# Patient Record
Sex: Male | Born: 1945 | Race: White | Hispanic: No | Marital: Married
Health system: Southern US, Community
[De-identification: ages and names within clinical notes are randomized; demographics above are authoritative.]

---

## 1998-12-26 ENCOUNTER — Encounter (HOSPITAL_COMMUNITY): Admission: RE | Admit: 1998-12-26 | Discharge: 1999-03-26 | Payer: Self-pay | Admitting: Gastroenterology

## 1999-11-20 ENCOUNTER — Encounter (HOSPITAL_COMMUNITY): Admission: RE | Admit: 1999-11-20 | Discharge: 2000-02-18 | Payer: Self-pay | Admitting: Gastroenterology

## 2004-11-12 ENCOUNTER — Ambulatory Visit: Payer: Self-pay | Admitting: Urology

## 2004-11-15 ENCOUNTER — Emergency Department: Payer: Self-pay | Admitting: Emergency Medicine

## 2004-11-16 ENCOUNTER — Ambulatory Visit (HOSPITAL_COMMUNITY): Admission: RE | Admit: 2004-11-16 | Discharge: 2004-11-16 | Payer: Self-pay | Admitting: Gastroenterology

## 2004-11-19 ENCOUNTER — Inpatient Hospital Stay: Payer: Self-pay | Admitting: Surgery

## 2004-12-10 ENCOUNTER — Encounter (HOSPITAL_COMMUNITY): Admission: RE | Admit: 2004-12-10 | Discharge: 2004-12-10 | Payer: Self-pay | Admitting: Gastroenterology

## 2005-02-04 ENCOUNTER — Encounter (HOSPITAL_COMMUNITY): Admission: RE | Admit: 2005-02-04 | Discharge: 2005-05-05 | Payer: Self-pay | Admitting: Gastroenterology

## 2005-05-24 ENCOUNTER — Encounter (HOSPITAL_COMMUNITY): Admission: RE | Admit: 2005-05-24 | Discharge: 2005-08-22 | Payer: Self-pay | Admitting: Gastroenterology

## 2007-06-12 ENCOUNTER — Ambulatory Visit: Payer: Self-pay | Admitting: Nephrology

## 2011-11-28 ENCOUNTER — Inpatient Hospital Stay: Payer: Self-pay | Admitting: Surgery

## 2011-11-28 LAB — COMPREHENSIVE METABOLIC PANEL
Albumin: 3.5 g/dL (ref 3.4–5.0)
Calcium, Total: 9.2 mg/dL (ref 8.5–10.1)
Chloride: 106 mmol/L (ref 98–107)
Co2: 24 mmol/L (ref 21–32)
Creatinine: 1.66 mg/dL — ABNORMAL HIGH (ref 0.60–1.30)
EGFR (Non-African Amer.): 43 — ABNORMAL LOW
Glucose: 156 mg/dL — ABNORMAL HIGH (ref 65–99)
Osmolality: 287 (ref 275–301)
Potassium: 4.2 mmol/L (ref 3.5–5.1)
SGOT(AST): 182 U/L — ABNORMAL HIGH (ref 15–37)
Sodium: 141 mmol/L (ref 136–145)
Total Protein: 7.3 g/dL (ref 6.4–8.2)

## 2011-11-28 LAB — CK TOTAL AND CKMB (NOT AT ARMC)
CK, Total: 109 U/L (ref 35–232)
CK-MB: 3.6 ng/mL (ref 0.5–3.6)

## 2011-11-28 LAB — CBC
HCT: 44.1 % (ref 40.0–52.0)
MCHC: 32.4 g/dL (ref 32.0–36.0)
MCV: 94 fL (ref 80–100)
Platelet: 201 10*3/uL (ref 150–440)
WBC: 12.3 10*3/uL — ABNORMAL HIGH (ref 3.8–10.6)

## 2011-11-29 LAB — CBC WITH DIFFERENTIAL/PLATELET
Basophil #: 0 10*3/uL (ref 0.0–0.1)
Basophil %: 0.3 %
Eosinophil #: 0 10*3/uL (ref 0.0–0.7)
Eosinophil %: 0.6 %
HCT: 38.6 % — ABNORMAL LOW (ref 40.0–52.0)
HGB: 12.7 g/dL — ABNORMAL LOW (ref 13.0–18.0)
Lymphocyte #: 0.2 10*3/uL — ABNORMAL LOW (ref 1.0–3.6)
MCH: 31.2 pg (ref 26.0–34.0)
MCHC: 33 g/dL (ref 32.0–36.0)
Monocyte #: 0.7 x10 3/mm (ref 0.2–1.0)
Monocyte %: 9.5 %
Neutrophil #: 6.2 10*3/uL (ref 1.4–6.5)
Neutrophil %: 86.3 %
WBC: 7.2 10*3/uL (ref 3.8–10.6)

## 2011-11-29 LAB — COMPREHENSIVE METABOLIC PANEL
Alkaline Phosphatase: 122 U/L (ref 50–136)
Anion Gap: 11 (ref 7–16)
Bilirubin,Total: 4.4 mg/dL — ABNORMAL HIGH (ref 0.2–1.0)
Calcium, Total: 7.9 mg/dL — ABNORMAL LOW (ref 8.5–10.1)
Chloride: 108 mmol/L — ABNORMAL HIGH (ref 98–107)
Creatinine: 1.8 mg/dL — ABNORMAL HIGH (ref 0.60–1.30)
EGFR (African American): 45 — ABNORMAL LOW
EGFR (Non-African Amer.): 39 — ABNORMAL LOW
Glucose: 85 mg/dL (ref 65–99)
Osmolality: 287 (ref 275–301)
Potassium: 4.2 mmol/L (ref 3.5–5.1)
SGOT(AST): 81 U/L — ABNORMAL HIGH (ref 15–37)
SGPT (ALT): 172 U/L — ABNORMAL HIGH
Sodium: 143 mmol/L (ref 136–145)

## 2011-11-29 LAB — BILIRUBIN, DIRECT: Bilirubin, Direct: 3 mg/dL — ABNORMAL HIGH (ref 0.00–0.20)

## 2011-11-29 LAB — LIPASE, BLOOD: Lipase: 101 U/L (ref 73–393)

## 2011-11-30 LAB — BASIC METABOLIC PANEL
BUN: 15 mg/dL (ref 7–18)
Chloride: 111 mmol/L — ABNORMAL HIGH (ref 98–107)
Co2: 24 mmol/L (ref 21–32)
Creatinine: 1.79 mg/dL — ABNORMAL HIGH (ref 0.60–1.30)
EGFR (African American): 45 — ABNORMAL LOW
EGFR (Non-African Amer.): 39 — ABNORMAL LOW
Glucose: 77 mg/dL (ref 65–99)
Osmolality: 288 (ref 275–301)
Sodium: 145 mmol/L (ref 136–145)

## 2011-11-30 LAB — HEPATIC FUNCTION PANEL A (ARMC)
Alkaline Phosphatase: 123 U/L (ref 50–136)
Bilirubin, Direct: 1.1 mg/dL — ABNORMAL HIGH (ref 0.00–0.20)
SGPT (ALT): 112 U/L — ABNORMAL HIGH
Total Protein: 5.4 g/dL — ABNORMAL LOW (ref 6.4–8.2)

## 2011-12-01 LAB — COMPREHENSIVE METABOLIC PANEL
Albumin: 2.2 g/dL — ABNORMAL LOW (ref 3.4–5.0)
Alkaline Phosphatase: 117 U/L (ref 50–136)
BUN: 21 mg/dL — ABNORMAL HIGH (ref 7–18)
Bilirubin,Total: 1.1 mg/dL — ABNORMAL HIGH (ref 0.2–1.0)
Calcium, Total: 7.7 mg/dL — ABNORMAL LOW (ref 8.5–10.1)
Co2: 26 mmol/L (ref 21–32)
Creatinine: 1.76 mg/dL — ABNORMAL HIGH (ref 0.60–1.30)
EGFR (Non-African Amer.): 40 — ABNORMAL LOW
Osmolality: 281 (ref 275–301)
Potassium: 3.9 mmol/L (ref 3.5–5.1)
SGOT(AST): 42 U/L — ABNORMAL HIGH (ref 15–37)
SGPT (ALT): 100 U/L — ABNORMAL HIGH
Sodium: 139 mmol/L (ref 136–145)

## 2011-12-01 LAB — CBC WITH DIFFERENTIAL/PLATELET
Basophil #: 0 10*3/uL (ref 0.0–0.1)
Basophil %: 0.3 %
Eosinophil #: 0 10*3/uL (ref 0.0–0.7)
Eosinophil %: 0.3 %
HCT: 36.9 % — ABNORMAL LOW (ref 40.0–52.0)
HGB: 12.4 g/dL — ABNORMAL LOW (ref 13.0–18.0)
Lymphocyte #: 0.3 10*3/uL — ABNORMAL LOW (ref 1.0–3.6)
Lymphocyte %: 4.2 %
MCH: 31.5 pg (ref 26.0–34.0)
MCHC: 33.6 g/dL (ref 32.0–36.0)
MCV: 94 fL (ref 80–100)
Monocyte #: 0.7 x10 3/mm (ref 0.2–1.0)
Monocyte %: 9.1 %
Neutrophil #: 6.7 10*3/uL — ABNORMAL HIGH (ref 1.4–6.5)
Neutrophil %: 86.1 %
Platelet: 163 10*3/uL (ref 150–440)
RBC: 3.93 10*6/uL — ABNORMAL LOW (ref 4.40–5.90)
RDW: 14 % (ref 11.5–14.5)
WBC: 7.7 10*3/uL (ref 3.8–10.6)

## 2011-12-02 LAB — COMPREHENSIVE METABOLIC PANEL
Alkaline Phosphatase: 148 U/L — ABNORMAL HIGH (ref 50–136)
Anion Gap: 9 (ref 7–16)
BUN: 18 mg/dL (ref 7–18)
Bilirubin,Total: 4.2 mg/dL — ABNORMAL HIGH (ref 0.2–1.0)
Calcium, Total: 8 mg/dL — ABNORMAL LOW (ref 8.5–10.1)
Co2: 29 mmol/L (ref 21–32)
Creatinine: 1.85 mg/dL — ABNORMAL HIGH (ref 0.60–1.30)
EGFR (African American): 44 — ABNORMAL LOW
EGFR (Non-African Amer.): 38 — ABNORMAL LOW
Glucose: 98 mg/dL (ref 65–99)
SGOT(AST): 36 U/L (ref 15–37)
SGPT (ALT): 80 U/L — ABNORMAL HIGH
Sodium: 142 mmol/L (ref 136–145)
Total Protein: 5.8 g/dL — ABNORMAL LOW (ref 6.4–8.2)

## 2011-12-02 LAB — BILIRUBIN, DIRECT: Bilirubin, Direct: 3.2 mg/dL — ABNORMAL HIGH (ref 0.00–0.20)

## 2011-12-03 LAB — COMPREHENSIVE METABOLIC PANEL
Albumin: 2.1 g/dL — ABNORMAL LOW (ref 3.4–5.0)
Alkaline Phosphatase: 145 U/L — ABNORMAL HIGH (ref 50–136)
Anion Gap: 11 (ref 7–16)
BUN: 25 mg/dL — ABNORMAL HIGH (ref 7–18)
Bilirubin,Total: 1.5 mg/dL — ABNORMAL HIGH (ref 0.2–1.0)
Chloride: 104 mmol/L (ref 98–107)
Creatinine: 1.69 mg/dL — ABNORMAL HIGH (ref 0.60–1.30)
EGFR (African American): 49 — ABNORMAL LOW
Glucose: 127 mg/dL — ABNORMAL HIGH (ref 65–99)
Potassium: 4.1 mmol/L (ref 3.5–5.1)
SGOT(AST): 25 U/L (ref 15–37)
Sodium: 142 mmol/L (ref 136–145)
Total Protein: 6.3 g/dL — ABNORMAL LOW (ref 6.4–8.2)

## 2011-12-03 LAB — PATHOLOGY REPORT

## 2012-01-16 ENCOUNTER — Ambulatory Visit: Payer: Self-pay | Admitting: Family Medicine

## 2012-12-29 IMAGING — CR DG CHEST 2V
1 series · 2 of 2 positions shown · non-contrast
Comparison: none

REASON FOR EXAM: [DATE] sats
COMMENTS:

[Series 2: w chest pa · 0.14mm/px · 2 of 2 slices shown]
[im 1/2]
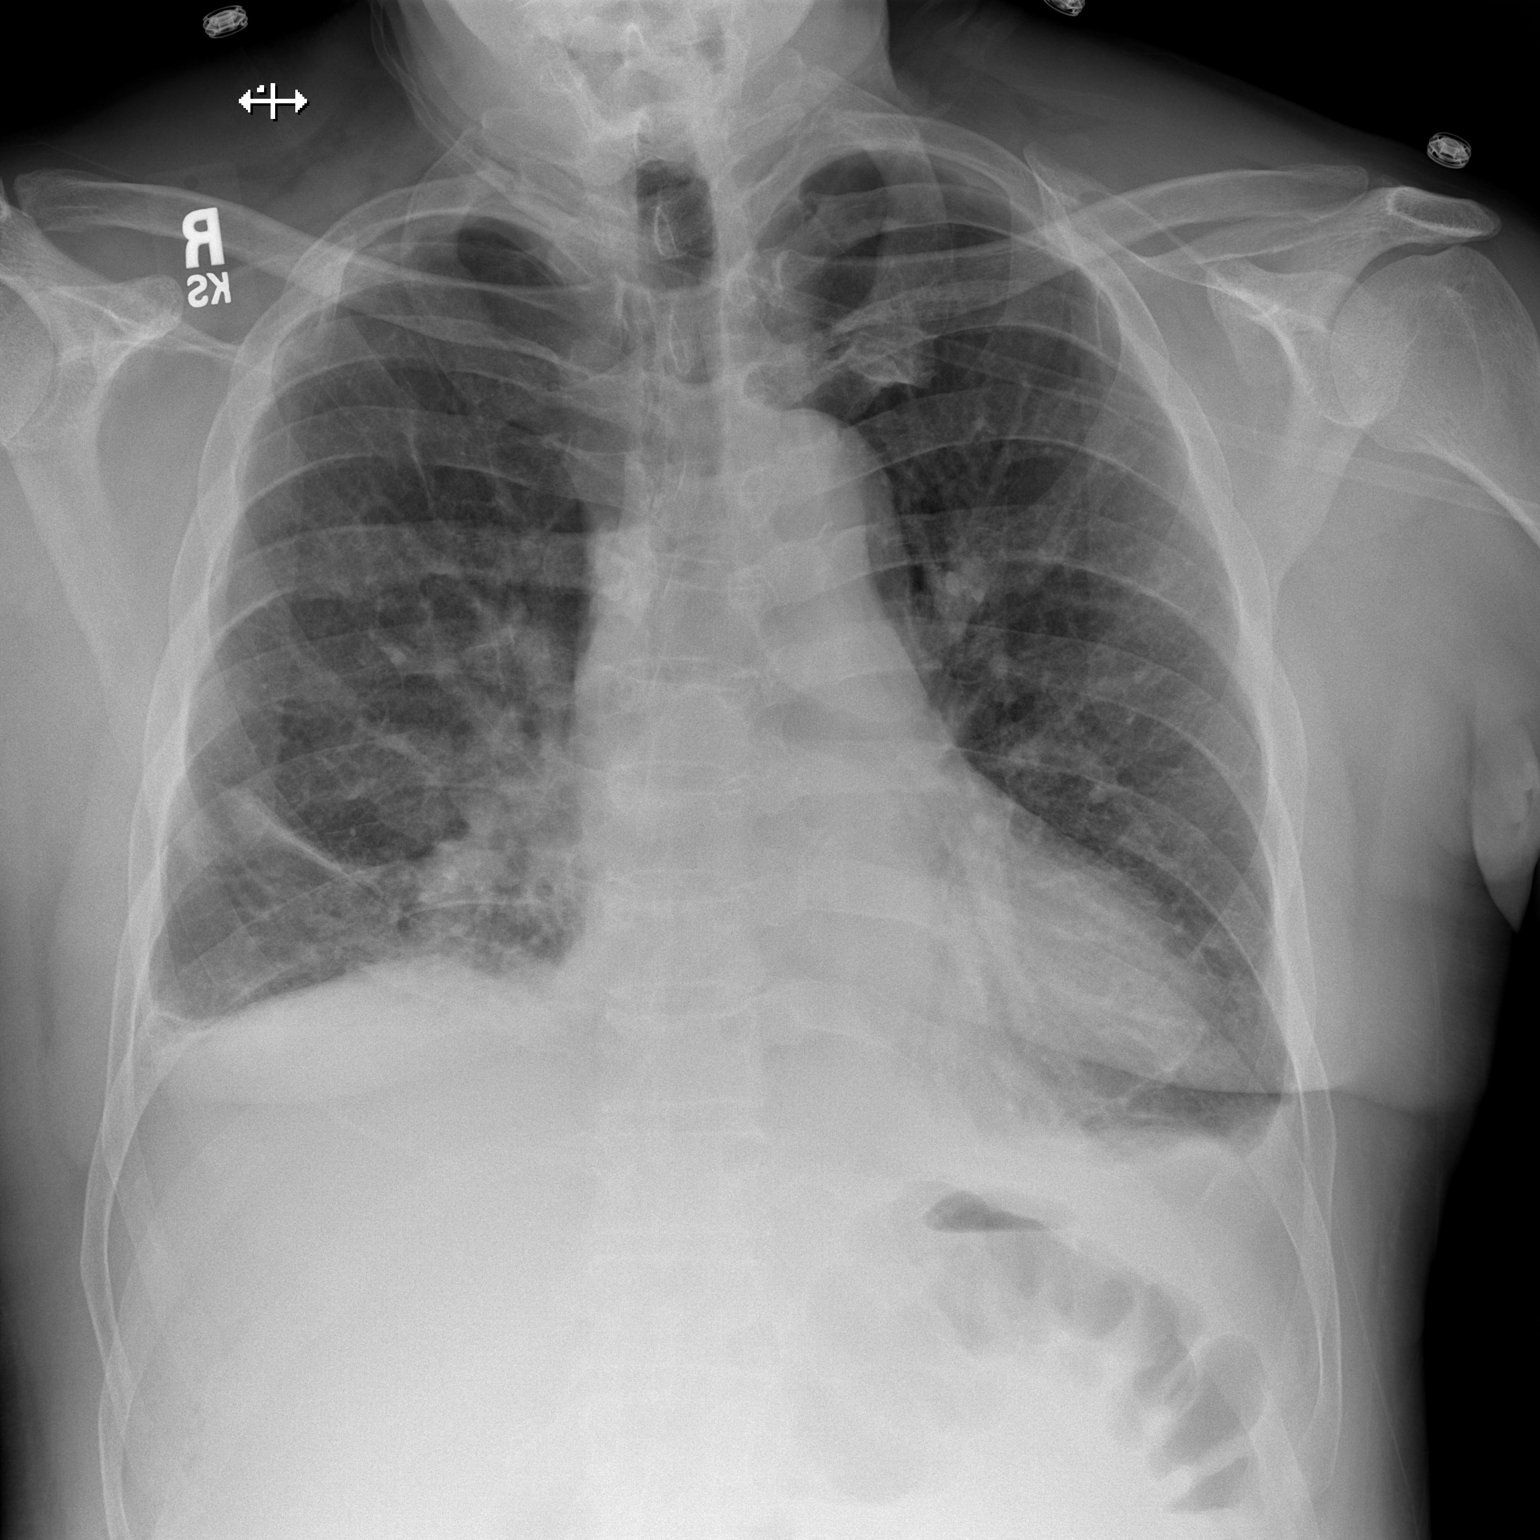
[im 2/2]
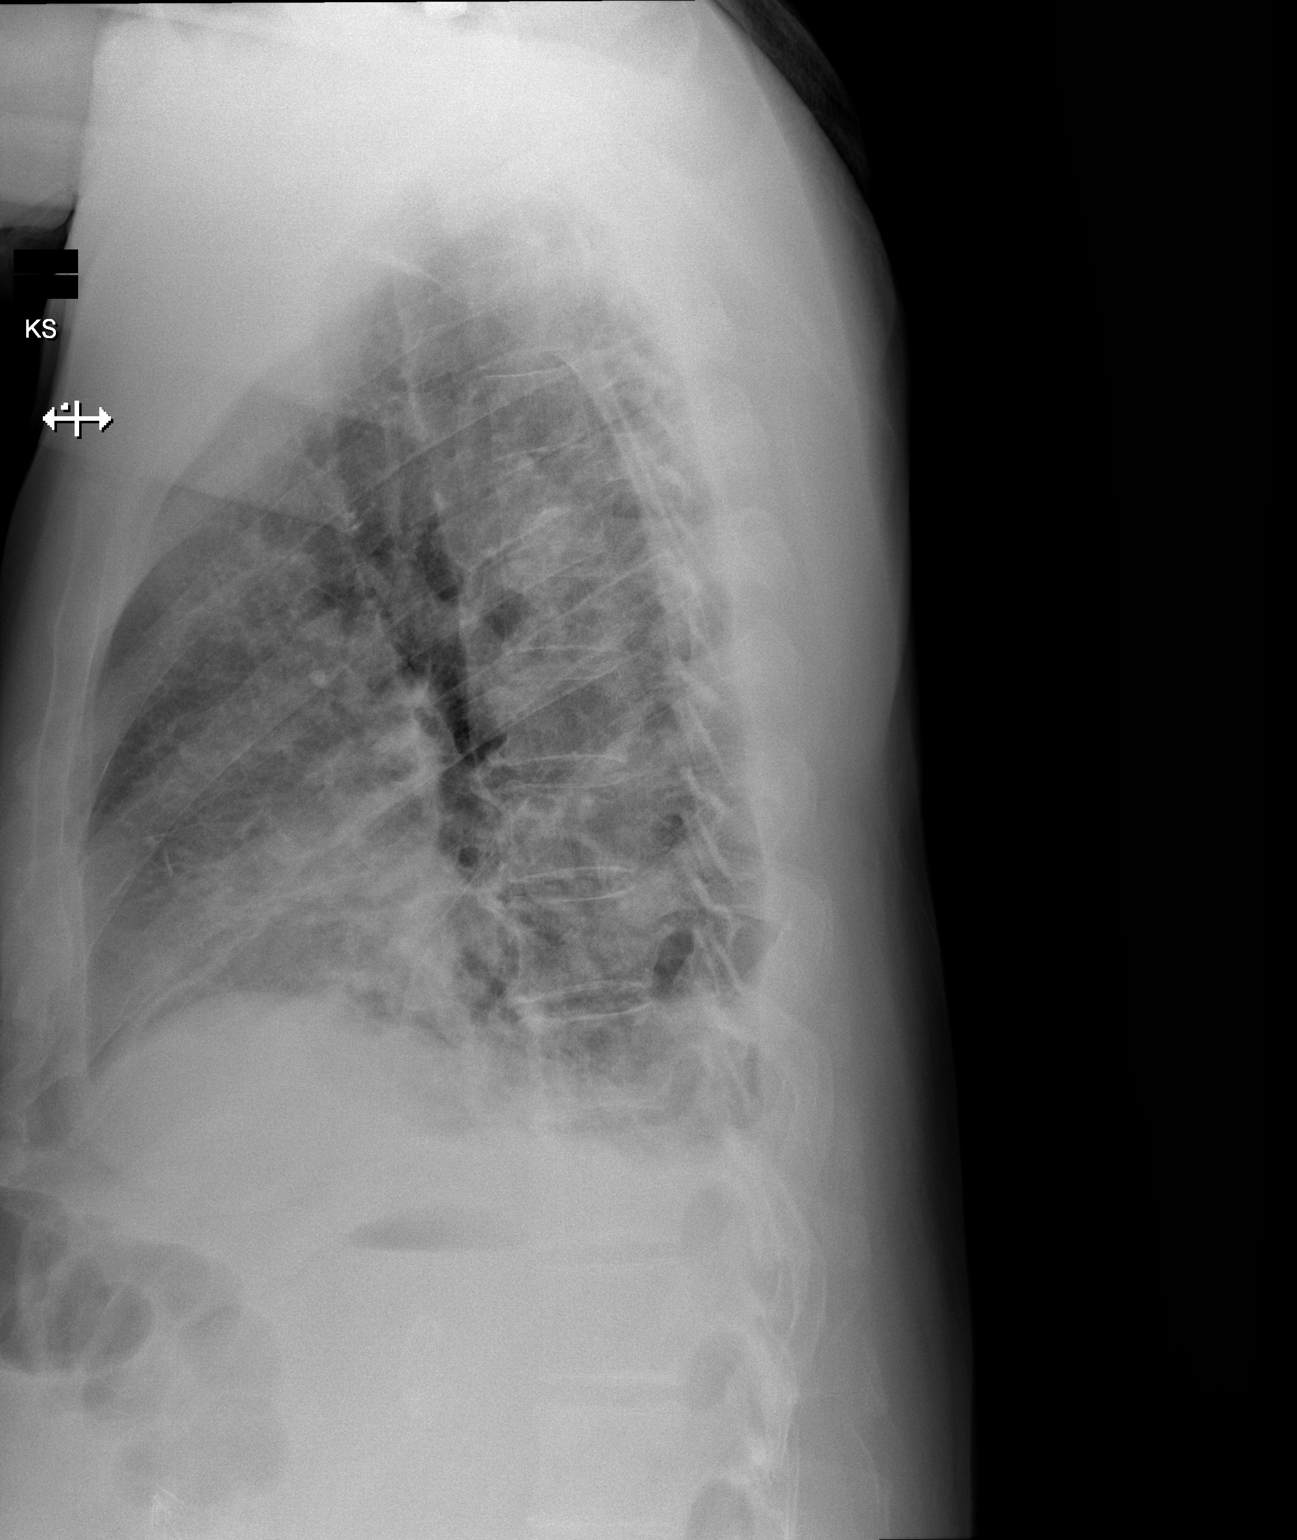

[2 of 2 positions shown; findings below may reference images not displayed]

PROCEDURE:     DXR - DXR CHEST PA (OR AP) AND LATERAL  - December 01, 2011 [DATE]

RESULT:     Comparison is made to a prior study dated 11/28/2011.

In the interim, there has been development of thickening of the interstitial
markings and peribronchial cuffing. The patient has taken a shallow
inspiration. Blunting of the costophrenic angles is identified. The cardiac
silhouette is moderately enlarged. Fluid is appreciated along the minor
fissure. No focal regions of consolidation are identified. The visualized
bony skeleton is unremarkable.
IMPRESSION: 1.  Interstitial infiltrate likely representing pulmonary edema. Trace to
small, bilateral effusions are appreciated. Surveillance evaluation status
post appropriate therapeutic regiment is recommended.
2.  Note, an infectious or inflammatory interstitial infiltrate cannot be
excluded, if clinically appropriate.

## 2013-03-17 ENCOUNTER — Other Ambulatory Visit: Payer: Self-pay | Admitting: Gastroenterology

## 2014-05-01 DEATH — deceased

## 2014-10-23 NOTE — H&P (Signed)
PATIENT NAME:  Jeffery Harrison, Fawaz MR#:  [id number] DATE OF BIRTH:  [birth year]  DATE OF ADMISSION:  11/28/2011  CHIEF COMPLAINT: Right upper quadrant pain.   HISTORY OF PRESENT ILLNESS: This is a patient who I was called to the Emergency Room to see for probable gallbladder disease, including choledocholithiasis and mild biliary pancreatitis. The patient describes a single and only episode of epigastrium right upper quadrant pain. It was radiating to his back around both upper quadrants, but right more than left. He had had some nausea but no emesis. His bowel movements are loose daily. He often has as many as 12 or 14 bowel movements a day due to his known diagnosis of Crohn's disease. He denies fevers or chills and, in fact at this point, he states that his pain is completely gone or nearly so. He denies jaundice or acholic stools.   PAST MEDICAL HISTORY:  1. Crohn's disease.  2. Kidney stones. 3. Hypertension.   PAST SURGICAL HISTORY:  1. Anal fistula repair or drainage. 2. Resection of small bowel in 1960 for the diagnosis of Crohn's disease.   MEDICATIONS: Lisinopril, Asacol and 6-mercaptopurine.   ALLERGIES: None.   FAMILY HISTORY: Noncontributory.   SOCIAL HISTORY: The patient works in Therapist, sportsproperty management. He does not smoke or drink.   REVIEW OF SYSTEMS: Ten-system review has been performed and negative with the exception of that mentioned in the history of present illness.   PHYSICAL EXAMINATION:  GENERAL: Healthy-appearing Caucasian male patient. He appears comfortable.  VITAL SIGNS: Temperature 97.7, pulse 64, respirations 20, blood pressure 146/69, 100% room air saturation. Pain scale of 3.  HEENT: No scleral icterus.   NECK: No palpable neck nodes.   CHEST: Clear to auscultation.   CARDIAC: Regular rate and rhythm.   ABDOMEN: The abdomen shows a long transverse infraumbilical incision scar which is well-healed. There is an umbilical hernia which is reducible and nontender.  There is minimal right upper quadrant tenderness. No Murphy's sign.   EXTREMITIES: Without edema. Calves are nontender.   NEUROLOGIC: Grossly intact.   INTEGUMENT: No jaundice.   LABORATORY, DIAGNOSTIC AND RADIOLOGICAL DATA:  Laboratory values demonstrate a lipase of 825, AST/ALT of 182 and 212, and a bilirubin of 3.1. Creatinine is 1.66, with a BUN of 19.  White blood cell count is 12.3, hemoglobin and hematocrit is 14 and 44.  An ultrasound shows stones.   ASSESSMENT AND PLAN: This is a patient with mild biliary pancreatitis and choledocholithiasis. I have recommended admission to the hospital, IV antibiotics, hydration, and pain and nausea control. When his lipase and LFTs improve, he should have a laparoscopic cholecystectomy with cholangiography to prevent repeat of this event. The procedure itself was described to the patient, and the rationale for this approach has been discussed. He and his wife understood and agreed to proceed with this plan.  ____________________________ Adah Salvageichard E. Excell Seltzerooper, MD rec:cbb D: 11/28/2011 16:35:58 ET T: 11/28/2011 16:54:46 ET JOB#: [id number] Lattie HawICHARD E Imberly Troxler MD ELECTRONICALLY SIGNED 12/09/2011 19:46

## 2014-10-23 NOTE — Consult Note (Signed)
Asked to perform today with sudden rise in LFT today. IOC suggests CBD stone. Some abd pain today. Bile draining well from ampulla. CBD normal. Biliary sphincterotomy performed. Balloon sweeps done. No stone or sludge or bile leak. Suspect stone passed on own. Clear liquid diet rest of today. Recheck LFT in AM. Thanks.  Electronic Signatures: Lutricia Feilh, Daesia Zylka (MD)  (Signed on 03-Jun-13 12:51)  Authored  Last Updated: 03-Jun-13 12:51 by Lutricia Feilh, Horald Birky (MD)

## 2014-10-23 NOTE — Consult Note (Signed)
Brief Consult Note: Diagnosis: biliary pancreatitis.   Patient was seen by consultant.   Consult note dictated.   Recommend to proceed with surgery or procedure.   Recommend further assessment or treatment.   Discussed with Attending MD.   Comments: Patietn seen and examined, full consult to follow. Patietn presenting with biliary pancreatitis.  no evidence of ductal dilation on us however increased bili today, but lipase now normal and other lfts are trending toward normal.  Possibly has passed gallstone, or possibly ball valving stone.  Agree with ductal evaluation to rule out choledocholithiasis.  Will order the MRCP today.  ERCP if there is evidence of retained cbd stone. Discussed with Dr Bluford Kaufmannh and Dr Anda KraftMarterre.  Electronic Signatures for Addendum Section:  Barnetta ChapelSkulskie, Martin (MD) (Signed Addendum [phone]-May-13 13:08)  patient with history of Crhons disease on 6mp and asacol, stable except for possible perianal fistula.  Barnetta ChapelSkulskie, Martin (MD) (Signed Addendum [phone]-May-13 20:12)  Please see full GI consult [phone]   Electronic Signatures: Barnetta ChapelSkulskie, Martin (MD)  (Signed 31-May-13 13:03)  Authored: Brief Consult Note   Last Updated: 31-May-13 20:12 by Barnetta ChapelSkulskie, Martin (MD)

## 2014-10-23 NOTE — Consult Note (Signed)
PATIENT NAME:  Jeffery Harrison, Jeffery Harrison MR#:  [id number] DATE OF BIRTH:  [birth year]  DATE OF CONSULTATION:  11/28/2011  REFERRING PHYSICIAN:   CONSULTING PHYSICIAN:  Christena Deem, MD  REASON FOR CONSULTATION: Gallstone pancreatitis, possible need for ERCP prior to laparoscopic cholecystectomy.   HISTORY OF PRESENT ILLNESS: Mr. Laurent is a 69 year old Caucasian male with a previous history of Crohn's disease. He is followed by Dr. Madilyn Fireman, gastroenterology in [place], for his Crohn's disease. He states that this past Wednesday night at about 10:00 in the evening he began to develop some very severe epigastric abdominal pain. This radiated around his back and around his ribs. He came to the Emergency Room and was admitted to the hospital with findings of pancreatitis. He stated that he had some mild nausea with it but no true overt nausea or vomiting. He states he feels currently very much better than on admission and denies any abdominal pain. He does have a history of Crohn's disease. He had had several years of problems prior to 1960, at that time underwent surgery with ileectomy and further diagnosis. He had some abscesses around his rectum in about 2007 or 2008. These were drained and occasionally he will have some drainage in this area. He has had several other manifestations of Crohn's disease to include pyoderma gangrenosum, iritis, arthropathies, and nephrolithiasis. His Crohn's disease otherwise has been relatively stable more recently, and he does take 6-MP as well as Asacol. His last colonoscopy was several years ago and his primary gastroenterologist has been in entreating him to get another colonoscopy.   PAST MEDICAL HISTORY:  1. Crohn's disease as stated.  2. Kidney stones.  3. Hypertension.  4. Terminal ileectomy approximately 1960 as noted.  5. Anal fistula repair/drainage.   OUTPATIENT MEDICATIONS:  1. Asacol either 8 or 400 mg daily. 2. Mercaptopurine 50 mg once a day.   3. Lisinopril 20 mg daily.   ALLERGIES: He has no known drug allergies.   GI FAMILY HISTORY: Negative for colorectal cancer, liver disease, or ulcers. However, he has a very strong family history on his father's side of multiple people needing to have their gallbladders removed.   SOCIAL HISTORY: Rare EtOH use. He is a nonsmoker. He does not use recreational drugs.   PHYSICAL EXAMINATION:  VITAL SIGNS: Temperature 98.3, pulse 65, respirations 18, blood pressure 115/68.   GENERAL: He is a well-appearing 69 year old Caucasian male in no acute distress.   HEAD: Normocephalic, atraumatic.   EYES: Anicteric.   NOSE: Septum midline. No lesions.   OROPHARYNX: No lesions.   NECK: Supple. No JVD. No lymphadenopathy. No thyromegaly.   HEART: Regular rate and rhythm.   LUNGS: Clear.   ABDOMEN: Soft, slightly protuberant. Bowel sounds positive, normoactive. The patient states he has been having both bowel movements earlier today as well as passing gas. He is nontender and nondistended.   EXTREMITIES: No clubbing, cyanosis, or edema.   NEUROLOGICAL: Cranial nerves II through XII grossly intact. Muscle strength bilaterally equal and symmetric, five out of five. Deep tendon reflexes bilaterally equal and symmetric.   LABORATORY, DIAGNOSTIC, AND RADIOLOGICAL DATA: On admission to the hospital he had a glucose of 156, BUN 19, creatinine 1.66, sodium 141, potassium 4.2, chloride 106, bicarbonate 24, lipase was 825, calcium 9.2. Hepatic profile showing a total protein of 7.3, albumin 3.5, total bilirubin 3.1, alkaline phosphatase 127, AST 182, ALT 212. One set of cardiac enzymes normal to include CK, CPK-MB, and troponin I. His hemogram yesterday showed a white count  of 12.3, hemoglobin and hematocrit 14.3/44.1, platelet count 201, MCV 94. Repeat labs earlier this morning showed a creatinine of 1.8 with a BUN of 20. His hepatic profile showed a bilirubin increase to 4.4, the direct fraction being 3.  However, his alkaline phosphatase has remained stable and indeed decreased to 122. His AST is down to 81, ALT at 172. His white count is now 7.2, hemoglobin/ hematocrit 12.7/38.6, platelet count 154. His lipase is now normal at 101. He did have a portable chest film showing no acute changes. He had an abdominal ultrasound showing cholelithiasis with associated thickening of the gallbladder wall and trace pericholecystic fluid suspicious for cholecystitis. There was some evidence of dilated intrahepatic ducts suggestive of common duct obstruction, although the common duct itself was not dilated. The pancreatic head was normal in appearance.   ASSESSMENT: Biliary pancreatitis. According to the patient he has a strong family history of gallbladder disease. With initial evaluation earlier this morning, agree with possible ERCP. We will discuss further with Dr. Bluford Kaufmannh. Continue current.    ADDENDUM: Today's date, 11/29/2011 at about 4:00 p.m.: I did discuss the case with Dr. Bluford Kaufmannh. He was not available for ERCP today. Dr. Bluford Kaufmannh further discussed the case with Dr. Niel HummerIftikhar, who contacted me. In the interim I discussed the case further with Dr. Anda KraftMarterre, decided to do MRCP to evaluate whether or not there were obvious stones in the duct requiring further attention, or whether the patient could go on cholecystectomy with intraoperative cholangiogram. The results of his MRCP as follows: The gallbladder was distended with gallbladder wall thickening and pericholecystic fluid. There was cholelithiasis. The common bile duct was normal in caliber. There was no evidence of choledocholithiasis. As such, discussed further with Dr. Anda KraftMarterre who recommended laparoscopic cholecystectomy as planned with IOC. We will see if his bilirubin in the morning has decreased, which would help a deciding factor in this management. If the bilirubin has gone up or if there is further evidence of stone that was not found on MRCP, we will be able to  contact Dr. Niel HummerIftikhar for ERCP over the weekend as needed.    ____________________________ Christena DeemMartin U. Kynzlie Hilleary, MD mus:bjt D:  11/29/2011 20:11:20 ET         T: 11/30/2011 08:52:30 ET         JOB#: [id number]  cc: Christena DeemMartin U. Leevon Upperman, MD, <Dictator> Dr. Madilyn FiremanHayes, gastroenterology, [place] William F. Marterre, MD Christena DeemMARTIN U Tyrian Peart MD ELECTRONICALLY SIGNED 12/03/2011 17:16

## 2014-10-23 NOTE — Discharge Summary (Signed)
PATIENT NAME:  Jeffery Harrison, Jeffery Harrison MR#:  [id number] DATE OF BIRTH:  01-13-1946  DATE OF ADMISSION:  11/28/2011 DATE OF DISCHARGE:  12/03/2011  FINAL DIAGNOSES:  1. Cholecystitis and choledocholithiasis.  2. Type 2 diabetes.  3. Nephrolithiasis. 4. Crohn's disease. 5. Biliary pancreatitis secondary to #1.   PRINCIPLE PROCEDURES:  1. Laparoscopic cholecystectomy with intraoperative cholangiography by Dr. Bretta BangEasterwood.  2. GI medicine consultation. 3. Endoscopic retrograde cholangiopancreatogram, ERCP with sphincterotomy by Dr. Bluford Kaufmannh on 12/02/2011.   HOSPITAL COURSE SUMMARY: The patient was admitted with abdominal pain. He had mild biliary pancreatitis at the time of admission.  He has a history of Crohn's disease treated medically. He was seen by gastroenterology. He was deemed suitable for laparoscopic cholecystectomy with intraoperative cholangiography on 06/01. This was performed by Dr. Bretta BangEasterwood. A cholangiogram demonstrated slow flow of bile into the duodenum. Postoperatively he had a bump in his liver function tests with bilirubin totaling 4. An ERCP was performed on 12/02/2011 which demonstrated no evidence of retained stone. The diagnosis at that point became that he had probably passed this stone as his bilirubin the following day on postoperative day #3 had returned to 1.5. His abdomen remained soft and nontender with no signs of post ERCP pancreatitis and he was deemed suitable and stable for discharge on that day.   DISCHARGE MEDICATIONS:  1. Percocet 5/325, 1 to 2 tabs p.o. every six hours as needed for pain.  2. Asacol 1 tab by mouth once a day. 3. Lisinopril 20 mg by mouth once a day. 4. Mercaptopurine 50 mg by mouth once a day.   DISCHARGE INSTRUCTIONS: Follow up with me in the office on 06/11 in the [place] location. Call with any questions or concerns. Patient may shower. Discharge instructions were provided.   ____________________________ Redge GainerMark A. Egbert GaribaldiBird, MD mab:cms D: 12/03/2011  09:17:46 ET T: 12/03/2011 15:27:02 ET JOB#: [id number]  cc: Loraine LericheMark A. Egbert GaribaldiBird, MD, <Dictator>  Raynald KempMARK A Jarica Plass MD ELECTRONICALLY SIGNED 12/03/2011 18:15

## 2014-10-23 NOTE — H&P (Signed)
Subjective/Chief Complaint RUQ pain    History of Present Illness called to ER for biliary pancreatitis inc LFT pt's pain has resolved mostly nausea no emesis mult loose stools per day is his nml for Crohn's disease no F/c    Past History HTN, Crohn's, nephrolith PSH resection of SB in 1960 anal fistula    Past Medical Health Hypertension   Past Med/Surgical Hx:  Kidney Stones:   Crohn's Disease:   ALLERGIES:  No Known Allergies:   Family and Social History:   Family History Non-Contributory    Social History negative tobacco, negative ETOH, prop mgmnt    Place of Living Home   Review of Systems:   Fever/Chills No    Cough No    Abdominal Pain Yes    Diarrhea Yes    Constipation No    Nausea/Vomiting Yes    SOB/DOE No    Chest Pain No    Dysuria No    Tolerating Diet No  Nauseated    Medications/Allergies Reviewed Medications/Allergies reviewed   Physical Exam:   GEN no acute distress    HEENT pink conjunctivae    NECK supple    RESP normal resp effort  clear BS    CARD regular rate    ABD positive tenderness  positive hernia  soft  transverse scar, UH nontender, neg Murphy's    LYMPH negative neck    EXTR negative edema    SKIN normal to palpation    PSYCH alert, A+O to time, place, person, good insight   Cardiac:  30-May-13 07:57    Troponin I < 0.02   CK, Total 109   CPK-MB, Serum 3.6  Routine Hem:  30-May-13 07:57    WBC (CBC) 12.3   RBC (CBC) 4.71   Hemoglobin (CBC) 14.3   Hematocrit (CBC) 44.1   Platelet Count (CBC) 201   MCV 94   MCH 30.4   MCHC 32.4   RDW 13.7  Routine Chem:  30-May-13 07:57    Glucose, Serum 156   BUN 19   Creatinine (comp) 1.66   Sodium, Serum 141   Potassium, Serum 4.2   Chloride, Serum 106   CO2, Serum 24   Calcium (Total), Serum 9.2  Hepatic:  30-May-13 07:57    Bilirubin, Total 3.1   Alkaline Phosphatase 127   SGPT (ALT) 212   SGOT (AST) 182   Total Protein, Serum 7.3    Albumin, Serum 3.5  Routine Chem:  30-May-13 07:57    Osmolality (calc) 287   eGFR (African American) 50   eGFR (Non-African American) 43   Anion Gap 11   Lipase 825   Radiology Results: [place]:    30-May-13 09:28, US Abdomen Limited Survey   US Abdomen Limited Survey   PRELIMINARY REPORT    The following is a PRELIMINARY Radiology report.  A final report will follow pending radiologist verification.      REASON FOR EXAM:    epigastric pain - radiating to back - remote hx of   gall stones - eval for cholec  COMMENTS:   Body Site: GB and Fossa, CBD, Head of Pancreas    PROCEDURE: [place]  - US ABDOMEN LIMITED SURVEY  - Nov 28 2011  9:28AM     RESULT: Limited Abdominal Ultrasound was performed for evaluation of the   biliary tract. There are noted multiple echodensities in the gallbladder   compatible with gallstones. One of the stones does not move from the  gallbladder neck as the patient changes position and could be impacted in   the gallbladder neck. There is thickening of thegallbladder wall which   measures 5.1 mm in diameter. The common bile duct measures 4.9 mm in   diameter which is within normal limits. There are; however, noted dilated   intrahepatic bile ducts and the possibility of partial common duct   obstruction cannot be excluded. The head of the pancreas is visualized     and is normal in appearance. There is noted a trace of free fluid   adjacent to the gallbladder wall.    IMPRESSION:     1.  Cholelithiasis with associated thickening of the gallbladderwall and   a trace of pericholecystic fluid. The findings are suspicious for   cholecystitis.  2.  There are dilated intrahepatic bile ducts suggestive of common duct   obstruction although the common duct as yet is not dilated.  3.  The pancreatic head is visualized and is normal in appearance.    Dictation Site: 2     Thank you for this opportunity to contribute to the care of your patient.     Dictated  By: Dionne Ano WALL, M.D., MD     Assessment/Admission Diagnosis biliary panc/choledocholith admit hydrate repeat labs lap chole and grams once labs improve/ poss in am   Electronic Signatures: Florene Glen (MD)  (Signed 30-May-13 10:36)  Authored: CHIEF COMPLAINT and HISTORY, PAST MEDICAL/SURGIAL HISTORY, ALLERGIES, FAMILY AND SOCIAL HISTORY, REVIEW OF SYSTEMS, PHYSICAL EXAM, LABS, Radiology, ASSESSMENT AND PLAN   Last Updated: 30-May-13 10:36 by Florene Glen (MD)

## 2014-10-23 NOTE — Op Note (Signed)
PATIENT NAME:  Jeffery Harrison, Jeffery Harrison MR#:  [id number] DATE OF BIRTH:  09-15-1945  DATE OF PROCEDURE:  11/30/2011  PREOPERATIVE DIAGNOSES:  1. Acute cholecystitis. 2. Gallstone pancreatitis.   POSTOPERATIVE DIAGNOSES:  1. Acute cholecystitis. 2. Gallstone pancreatitis.   OPERATIVE PROCEDURES:  1. Laparoscopic cholecystectomy with intraoperative cholangiogram. 2. Laparoscopic primary umbilical hernia repair.   SURGEON: Cindie CrumblyLee H. Nahlia Hellmann, MD  INDICATIONS FOR SURGERY: Patient is a 69 year old male who has a significant history of Crohn's disease who presented to the Emergency Department on 11/28/2011 with right upper quadrant pain and underwent ultrasound which showed cholelithiasis with thickening gallbladder wall and a trace of pericholecystic fluid with also common bile duct measuring 4.9 mm but dilated intrahepatic bile ducts. His labs at that time showed an elevation in his lipase and bilirubin. He underwent an M.R.C.P. which showed no common bile duct stones. His bilirubin today decreased to 1.9 from 4.4. At this time discussed proceeding with laparoscopic cholecystectomy with intraoperative cholangiogram and also repair of his umbilical hernia with primary sutures.   DESCRIPTION OF PROCEDURE: Risks and benefits discussed with patient who wished to proceed. Patient brought to Operating Room, placed in supine position. Once adequate general anesthesia had been attained, and appropriate monitoring devices attached, patient's abdomen was prepped and draped in sterile surgical fashion. A Veress needle was placed in the left upper quadrant. Insufflation was obtained to 15 mmHg of carbon dioxide. An incision was made in the right subcostal area and a 5 mm trocar was placed under direct vision because of the umbilical hernia. The Veress needle was inspected, noted to be in good position with no injury to any organs. The patient was noted to have an umbilical hernia with some adhesions to the abdominal wall. A  port was placed to the right lateral aspect of this hernia  which was a 5 mm trocar, an 11 mm epigastric and another subcostal 5 mm trocar also placed under direct vision. Gallbladder was distended. It was decompressed with needle decompression then grasped and retracted cephalad and laterally exposing triangle of Calot. The gallbladder was very friable and tore easily. The cystic duct and cystic artery were identified and skeletonized. The cystic duct was clipped once on the gallbladder side and the ductotomy was made and the cholangiograph catheter was placed in place. Several attempts were made to get a good cholangiogram. There appeared to be slow flow into the duodenum but there was flow. We gave some glucagon but we still didn't have great flow in the duodenum and there appeared to be a filling defect. I decided to get ERCP postop to further evaulate and removed the cholangiogram catheter and clipped the cystic duct twice proximally. The cystic artery was clipped once on the gallbladder side, twice proximally as well. These structures were divided with scissors and gallbladder was removed from the gallbladder fossa with electrocautery and placed in Endobag and removed through the 11 mm epigastric trocar port site. The epigastric trocar site was closed with a suture passer and the 0 Vicryl suture. The adhesions on the abdominal wall were taken down sharply from the subcostal trocars. The hernia was incarcerated with omentum. This was reduced. The hernia defect was then closed with a suture passer using the 0 Vicryl figure of eight suture. Hemostasis was checked in gallbladder fossa, it was adequate. The clips were checked and noted to be intact. At this time the trocars were removed. The abdomen was desufflated. Skin was closed with interrupted 4-0 Monocryl subcuticular sutures, Steri-Strips and sterile  dressing were applied. Patient tolerated procedure well. No complications.Counts were  correct.  ____________________________ Cindie Crumbly. Bretta Bang, MD lhe:cms D: 11/30/2011 16:11:42 ET T: 11/30/2011 16:14:39 ET JOB#: [id number]  cc: Cindie Crumbly. Bretta Bang, MD, <Dictator>  Lind Guest MD ELECTRONICALLY SIGNED 12/03/2011 16:21

## 2014-10-23 NOTE — Consult Note (Signed)
Chief Complaint:   Subjective/Chief Complaint doing well post op.  mild abdominal discomfort.  no n/v.  not passing flatus or bm.   VITAL SIGNS/ANCILLARY NOTES: **Vital Signs.:   02-Jun-13 14:38   Vital Signs Type Routine   Temperature Temperature (F) 100.4   Celsius 38   Temperature Source oral   Pulse Pulse 74   Pulse source per Dinamap   Respirations Respirations 18   Systolic BP Systolic BP 169   Diastolic BP (mmHg) Diastolic BP (mmHg) 74   Mean BP 105   BP Source vital sign device   Pulse Ox % Pulse Ox % 94   Pulse Ox Activity Level  At rest   Oxygen Delivery 2L   Brief Assessment:   Cardiac Regular    Respiratory clear BS    Gastrointestinal details normal mild distension, bs positive, minimal /appropriate pain.   Assessment/Plan:  Assessment/Plan:   Assessment 1) biliary pancreatitis-resolved 2) s/p lccy-  doing well.  3) possible retained CBD stone on IOC.  MRCP indicated no definate/no choledocholithiasis.  of note bili is 1.1, ap normal.    Plan 1) recheck labs in am if bili or ap up, increased concern for retained stone.  likely ERCP.   Electronic Signatures: Barnetta ChapelSkulskie, Jahni Paul (MD)  (Signed 02-Jun-13 15:33)  Authored: Chief Complaint, VITAL SIGNS/ANCILLARY NOTES, Brief Assessment, Assessment/Plan   Last Updated: 02-Jun-13 15:33 by Barnetta ChapelSkulskie, Cabela Pacifico (MD)
